# Patient Record
Sex: Female | Born: 1980 | Race: White | Hispanic: No | Marital: Married | Smoking: Never smoker
Health system: Southern US, Community
[De-identification: ages and names within clinical notes are randomized; demographics above are authoritative.]

## PROBLEM LIST (undated history)

## (undated) DIAGNOSIS — G510 Bell's palsy: Secondary | ICD-10-CM

## (undated) HISTORY — PX: LAPAROSCOPIC VAGINAL HYSTERECTOMY: SUR798

## (undated) HISTORY — PX: VERTICAL BANDED GASTROPLASTY: SHX1102

## (undated) HISTORY — PX: ABDOMINAL HYSTERECTOMY: SHX81

---

## 2005-10-02 ENCOUNTER — Ambulatory Visit: Payer: Self-pay | Admitting: Internal Medicine

## 2006-05-04 ENCOUNTER — Ambulatory Visit: Payer: Self-pay | Admitting: Family Medicine

## 2006-05-12 ENCOUNTER — Ambulatory Visit: Payer: Self-pay | Admitting: Family Medicine

## 2006-05-12 LAB — CONVERTED CEMR LAB
ALT: 15 units/L (ref 0–40)
AST: 14 units/L (ref 0–37)
Albumin: 3.7 g/dL (ref 3.5–5.2)
BUN: 14 mg/dL (ref 6–23)
Calcium: 9.1 mg/dL (ref 8.4–10.5)
Chloride: 105 meq/L (ref 96–112)
GFR calc non Af Amer: 94 mL/min
LDL Cholesterol: 96 mg/dL (ref 0–99)
VLDL: 11 mg/dL (ref 0–40)

## 2006-08-18 ENCOUNTER — Ambulatory Visit (HOSPITAL_BASED_OUTPATIENT_CLINIC_OR_DEPARTMENT_OTHER): Admission: RE | Admit: 2006-08-18 | Discharge: 2006-08-18 | Payer: Self-pay | Admitting: Orthopedic Surgery

## 2006-08-25 ENCOUNTER — Encounter: Payer: Self-pay | Admitting: Internal Medicine

## 2006-12-22 ENCOUNTER — Ambulatory Visit: Payer: Self-pay | Admitting: Family Medicine

## 2006-12-22 DIAGNOSIS — R1032 Left lower quadrant pain: Secondary | ICD-10-CM | POA: Insufficient documentation

## 2006-12-22 DIAGNOSIS — R1012 Left upper quadrant pain: Secondary | ICD-10-CM

## 2006-12-22 LAB — CONVERTED CEMR LAB
Blood in Urine, dipstick: NEGATIVE
Ketones, urine, test strip: NEGATIVE
Nitrite: NEGATIVE
RBC / HPF: 0
Urobilinogen, UA: NEGATIVE
WBC, UA: 0 cells/hpf

## 2006-12-23 ENCOUNTER — Encounter (INDEPENDENT_AMBULATORY_CARE_PROVIDER_SITE_OTHER): Payer: Self-pay | Admitting: Internal Medicine

## 2006-12-24 ENCOUNTER — Telehealth (INDEPENDENT_AMBULATORY_CARE_PROVIDER_SITE_OTHER): Payer: Self-pay | Admitting: Internal Medicine

## 2007-01-04 ENCOUNTER — Telehealth (INDEPENDENT_AMBULATORY_CARE_PROVIDER_SITE_OTHER): Payer: Self-pay | Admitting: Internal Medicine

## 2007-09-24 ENCOUNTER — Inpatient Hospital Stay (HOSPITAL_COMMUNITY): Admission: AD | Admit: 2007-09-24 | Discharge: 2007-09-24 | Payer: Self-pay | Admitting: Obstetrics

## 2007-09-29 ENCOUNTER — Inpatient Hospital Stay (HOSPITAL_COMMUNITY): Admission: AD | Admit: 2007-09-29 | Discharge: 2007-09-30 | Payer: Self-pay | Admitting: Obstetrics and Gynecology

## 2007-09-30 ENCOUNTER — Inpatient Hospital Stay (HOSPITAL_COMMUNITY): Admission: AD | Admit: 2007-09-30 | Discharge: 2007-10-02 | Payer: Self-pay | Admitting: Obstetrics and Gynecology

## 2007-12-28 ENCOUNTER — Ambulatory Visit: Payer: Self-pay | Admitting: Family Medicine

## 2008-01-03 ENCOUNTER — Telehealth (INDEPENDENT_AMBULATORY_CARE_PROVIDER_SITE_OTHER): Payer: Self-pay | Admitting: Internal Medicine

## 2008-01-10 ENCOUNTER — Telehealth: Payer: Self-pay | Admitting: Family Medicine

## 2008-03-20 ENCOUNTER — Ambulatory Visit: Payer: Self-pay | Admitting: Family Medicine

## 2008-04-10 ENCOUNTER — Telehealth (INDEPENDENT_AMBULATORY_CARE_PROVIDER_SITE_OTHER): Payer: Self-pay | Admitting: Internal Medicine

## 2010-08-29 NOTE — Assessment & Plan Note (Signed)
[facility]                           [facility] OFFICE NOTE   Erin Stephenson, Erin Stephenson                           MRN:          [id number]  DATE:05/04/2006                            DOB:          1980-09-18    NEW PATIENT HISTORY AND PHYSICAL:   CHIEF COMPLAINT:  A 30 year old white female here to establish with new  doctor.   HISTORY OF PRESENT ILLNESS:  Erin Stephenson has not seen a doctor other than  her gynecologist in many years.  She comes to the clinic today with the  following two acute issues:   1. Rash, acute:  She said she has been having a problem for about 2      weeks.  She noted red bumps initially begin across her arm, back      and inner thighs.  She changed dryer sheets, washed her clothes      again, began taking oatmeal baths and taking Benadryl, but the      areas remained to be very itchy.  She notes the redness and      irritation most after getting out of the shower.  She denies any      new medications, new perfumes or lotions, no new foods.  She has no      sick contacts or anyone in the family with a similar rash.  She      denies shortness of breath or difficulty swallowing.   1. Fluid in left ear:  She said she has had problems with congestion      for about 1 week.  She had some mild face pressure, sore throat and      postnasal drip.  She had some popping in both of her ears but      denies ear pain.  She has been taking Sudafed and Tylenol for 1      day.   REVIEW OF SYSTEMS:  No headache, no dizziness, no glasses, no hearing  problems.  No dyspnea, no chest pain, no palpitations.  No nausea,  vomiting, diarrhea or constipation.  She feels warmer than other people.  She has dry skin.  She has been sluggish off and on for 2 years.  She  does have a family history of hypothyroidism.   PAST MEDICAL HISTORY:  1. Endometriosis.  2. Chlamydia.  3. Ovarian cyst.  4. Abnormal Pap smear.  5. Dyspareunia.   HOSPITALIZATIONS,  SURGERIES, PROCEDURES:  1. 2002, cryotherapy.  2. Pap smear June 2007 within normal limits.   ALLERGIES:  None.   MEDICATIONS:  None.   SOCIAL HISTORY:  She does not smoke.  She drinks 1-2 alcoholic beverages  a week.  She is a Actuary at [employer].  She has been  married for 3 years.  She denies drug abuse.  She has no children.  She  does not get any regular exercise but was a Pharmacist, community in college.  She eats fruits, vegetables and a healthy diet.  She avoids fast food  and drinks lots of water.   FAMILY HISTORY:  Father alive at age 50, healthy.  Mother alive at age  17, healthy.  Paternal grandmother and paternal grandfather with  hypothyroidism.  She has two paternal uncles who had MI at an early age,  respectively age 71 and 19.  She has one sister, who is healthy.  She  has a maternal grandfather who has diabetes and no family history of  cancer.   PHYSICAL EXAMINATION:  VITAL SIGNS:  Height 4 feet 11-1/2 inches.  Weight 140.6.  Blood pressure 102/60, pulse 84, temperature 98.5.  GENERAL:  A healthy-appearing female in no apparent distress.  HEENT:  PERRLA.  Extraocular muscles intact.  Oropharynx with slight  erythema.  No tonsillar enlargement.  No tonsillar plaques.  Clear fluid  bilateral TMs, left greater than right.  No thyromegaly.  No  lymphadenopathy, supraclavicular or cervical.  LUNGS:  Clear to auscultation bilaterally.  No wheezes, rales or  rhonchi.  ABDOMEN:  Soft, nontender, normoactive bowel sounds, no  hepatosplenomegaly.  CARDIOVASCULAR:  Regular rate and rhythm, no murmurs, rubs or gallops.  Normal PMI.  2+ peripheral pulses.  No peripheral edema.  SKIN:  Occasional red, raised papules on bilateral arms, low back and  inner thighs.  Very infrequent, dry, flaky skin throughout.  MUSCULOSKELETAL:  Strength 5/5 in upper and lower extremities.  NEUROLOGIC:  Cranial nerves II-XII grossly intact.  Alert and oriented  x3.   ASSESSMENT AND  PLAN:  1. Rash, acute:  This is most likely secondary to dry skin and dry      winter air versus allergic reaction.  She will continue to use      Benadryl at night and may use Claritin during the day.  She will      continue with gentle soaps and will look to anything that she could      be allergic to.  There is no sign of burrows suggesting scabies.      She will use 0.1% triamcinolone cream on several of the areas as      needed for itchiness and redness over the next 2 weeks, 30 g given.      She will contact me if the rash does not improve with the steroid      cream and time.  2. Viral upper respiratory infection:  At this point in time she does      not have a bacterial infection.  She was encouraged to continue      Sudafed, Tylenol p.r.n.  The ear popping is most likely secondary      to eustachian tube dysfunction.  If congestion continues long-term,      she may need a nasal steroid.  3. Prevention.  Given her family history of early myocardial      infarction as well as her family history of hypothyroidism, I will      check a baseline cholesterol panel as well as a TSH.     Kerby Nora, MD  Electronically Signed    AB/MedQ  DD: 05/04/2006  DT: 05/04/2006  Job #: [id number]

## 2010-08-29 NOTE — Op Note (Signed)
NAMERYENN, HOWETH                  ACCOUNT NO.:  [id number]   MEDICAL RECORD NO.:  [id number]          PATIENT TYPE:  AMB   LOCATION:  [facility]SC                          FACILITY:  [facility]   PHYSICIAN:  Cindee Salt, M.D.       DATE OF BIRTH:  1980-04-15   DATE OF PROCEDURE:  08/18/2006  DATE OF DISCHARGE:                               OPERATIVE REPORT   PREOPERATIVE DIAGNOSIS:  Scapholunate ligament tear, right wrist.   POSTOPERATIVE DIAGNOSIS:  Scapholunate ligament tear, right wrist,  triangular fibrocartilage complex tear plus lunotriquetral tear, plus  subluxating extensor carpi ulnaris tendon, right wrist.   OPERATION:  Arthroscopy right wrist with for shrinkage debridement,  scapholunate lunotriquetral triangular fibrocartilage complex tear open  repair extensor carpi ulnaris sheath, right wrist.   SURGEON:  Cindee Salt, M.D.   ASSISTANTCarolyne Fiscal R.N.   ANESTHESIA:  Infraclavicular block.   ANESTHESIOLOGIST:  Zenon Mayo, MD.   HISTORY:  The patient is a 30 year old female who suffered an injury to  her right wrist.  This has not responded to conservative treatment.  She  has had continued pain and discomfort.  MRI reveals a scapholunate  ligament injury of questionable history and duration with an unknown  age.  She is admitted for arthroscopic inspection.  She also had  subluxation of her ECU sheath responding to injection.  She is admitted  for arthroscopy, possible debridement shrinkage, pinning of scapholunate  ligament with open repair ECU sheath.  She is aware of risks and  complications including infection, recurrence, injury to arteries,  nerves, tendons, numbness and tingling of the dorsal ulnar aspect of her  hand, dystrophy.  She has elected to proceed to have this done in the  preoperative area.  The patient is seen, questions encouraged and  answered.  The extremity marked by both the patient and surgeon.  Antibiotic given.   PROCEDURE:  The patient was  given infraclavicular block, brought to the  operating room where she was prepped and draped using DuraPrep, supine  position, right arm free after 3-minute dry time she was draped.  The  arthroscopy tower was placed 10 pounds traction applied.  The joint  inflated through 3/4 portal.  Care taken to break protect the EPL  tendon.  The transverse incision was made, deepened with hemostat, blunt  trocar was used enter the joint.  Joint was inspected.  The scapholunate  ligament was noted be torn.  The volar radial ligaments were intact.  There was some abrasions on the lunate.  The lunate fossa the ulnar side  showed no injury to the volar ligaments over the dorsal ligament injury  at the lunotriquetral and TFCC tear.  Irrigation catheter was placed in  6U, the scope was then positioned after a 04/05 portal was opened.  This  was localized with 22 gauge needle.  A probe was used to probe the TFCC  tear.  The lunotriquetral ligament was then inspected.  The majority was  found to be intact with a small tear on the dorsal aspect.  The  midcarpal joint  was then inspected after localization with a 22 gauge  needle.  A hemostat was used to enter the joint through a transverse  incision.  The joint was inspected.  A Geissler 2 type injury was  present to the scapholunate ligament.  There was no instability to the  lunotriquetral joint.  The articular surface of the STT midcarpal joint  showed no significant changes.  The scope was then reintroduced into the  03/04 portal and debridement triangle fibrocartilage complex was  performed with an Arthrowand and punch, a full radius shaver.  The  dorsal synovitis was then debrided with the Arthrowand, a shrinkage was  then performed of the dorsal aspect of the lunotriquetral joint.  The  scope was then introduced in the ulnar side and shrinkage performed to  the scapholunate.  The dorsal margin of the scapholunate ligament was  found to be intact.  This  significantly tightened the scapholunate  interval.  Debridement of the dorsal capsule with the Arthrowand was  performed removing any synovitis present.  There was no bleeding noted  in any of the ligaments, as such these were all felt to be old.  The  instruments were removed.  The limb was exsanguinated after removal from  the arthroscopy tower. The tourniquet placed high on the arm was  inflated to 250 mmHg.  A longitudinal incision was then made over the  ECU tendon distally, carried down through subcutaneous tissue.  Bleeders  electrocauterized.  The dorsal sensory branch of the ulnar nerve was  looked for but was not seen in the wound.  The ECU sheath was then  opened.  A flap was then created lifting it from the palmar aspect using  retinaculum.  This was left attached dorsally.  The ECU sheath was  opened.  The floor was explored.  No lesions were noted.  The flap was  then used to recreate the sheath, bringing it back over the tendon  holding it in a firm position in the tunnel through the ulna.  This was  sutured into position with interrupted 3-0 Vicryl sutures.  The wound  was irrigated.  The portals and incision was then closed with  interrupted 5-0 Vicryl Rapide sutures.  A sterile compressive dressing  long-arm splint applied.  The patient tolerated the procedure well and  was taken to the recovery observation in satisfactory condition.  She is  discharged home to return to [facility] [place] in one week on  Percocet.           ______________________________  Cindee Salt, M.D.     GK/MEDQ  D:  08/18/2006  T:  08/18/2006  Job:  [id number]

## 2011-01-08 LAB — CBC
HCT: 36.1
Hemoglobin: 10.4 — ABNORMAL LOW
Hemoglobin: 12.3
MCHC: 33.9
Platelets: 170
RBC: 3.53 — ABNORMAL LOW
WBC: 15.2 — ABNORMAL HIGH
WBC: 19.9 — ABNORMAL HIGH

## 2013-02-17 ENCOUNTER — Other Ambulatory Visit (HOSPITAL_COMMUNITY): Payer: Self-pay | Admitting: Obstetrics

## 2013-02-17 DIAGNOSIS — Z308 Encounter for other contraceptive management: Secondary | ICD-10-CM

## 2013-03-10 ENCOUNTER — Ambulatory Visit (HOSPITAL_COMMUNITY)
Admission: RE | Admit: 2013-03-10 | Discharge: 2013-03-10 | Disposition: A | Discharge status: Home / Self Care | Payer: No Typology Code available for payment source | Source: Ambulatory Visit | Attending: Obstetrics | Admitting: Obstetrics

## 2013-03-10 DIAGNOSIS — Z308 Encounter for other contraceptive management: Secondary | ICD-10-CM

## 2013-03-10 DIAGNOSIS — N921 Excessive and frequent menstruation with irregular cycle: Secondary | ICD-10-CM | POA: Insufficient documentation

## 2013-03-10 DIAGNOSIS — Z3049 Encounter for surveillance of other contraceptives: Secondary | ICD-10-CM | POA: Insufficient documentation

## 2013-03-10 MED ORDER — IOHEXOL 300 MG/ML  SOLN
20.0000 mL | Freq: Once | INTRAMUSCULAR | Status: AC | PRN
  Administered 2013-03-10: 20 mL

## 2014-03-02 DIAGNOSIS — G8929 Other chronic pain: Secondary | ICD-10-CM | POA: Insufficient documentation

## 2014-09-21 DIAGNOSIS — F5101 Primary insomnia: Secondary | ICD-10-CM | POA: Insufficient documentation

## 2014-10-16 ENCOUNTER — Encounter: Payer: Self-pay | Admitting: Emergency Medicine

## 2014-10-16 ENCOUNTER — Ambulatory Visit
Admission: EM | Admit: 2014-10-16 | Discharge: 2014-10-16 | Disposition: A | Discharge status: Home / Self Care | Payer: BLUE CROSS/BLUE SHIELD | Attending: Family Medicine | Admitting: Family Medicine

## 2014-10-16 DIAGNOSIS — R200 Anesthesia of skin: Secondary | ICD-10-CM | POA: Diagnosis not present

## 2014-10-16 DIAGNOSIS — R208 Other disturbances of skin sensation: Secondary | ICD-10-CM

## 2014-10-16 DIAGNOSIS — H5712 Ocular pain, left eye: Secondary | ICD-10-CM | POA: Diagnosis present

## 2014-10-16 HISTORY — DX: Bell's palsy: G51.0

## 2014-10-16 LAB — CBC WITH DIFFERENTIAL/PLATELET
Basophils Absolute: 0.1 10*3/uL (ref 0–0.1)
Basophils Relative: 1 %
Eosinophils Absolute: 0.1 10*3/uL (ref 0–0.7)
Eosinophils Relative: 2 %
HCT: 41.8 % (ref 35.0–47.0)
Hemoglobin: 14 g/dL (ref 12.0–16.0)
Lymphocytes Relative: 29 %
Lymphs Abs: 2.2 10*3/uL (ref 1.0–3.6)
MCH: 28 pg (ref 26.0–34.0)
MCHC: 33.6 g/dL (ref 32.0–36.0)
MCV: 83.2 fL (ref 80.0–100.0)
Monocytes Absolute: 0.4 10*3/uL (ref 0.2–0.9)
Monocytes Relative: 6 %
Neutro Abs: 4.9 10*3/uL (ref 1.4–6.5)
Neutrophils Relative %: 62 %
Platelets: 194 10*3/uL (ref 150–440)
RBC: 5.02 MIL/uL (ref 3.80–5.20)
RDW: 13.9 % (ref 11.5–14.5)
WBC: 7.8 10*3/uL (ref 3.6–11.0)

## 2014-10-16 LAB — COMPREHENSIVE METABOLIC PANEL
ALT: 20 U/L (ref 14–54)
AST: 16 U/L (ref 15–41)
Albumin: 4.6 g/dL (ref 3.5–5.0)
Alkaline Phosphatase: 78 U/L (ref 38–126)
Anion gap: 9 (ref 5–15)
BUN: 14 mg/dL (ref 6–20)
CO2: 27 mmol/L (ref 22–32)
Calcium: 9.3 mg/dL (ref 8.9–10.3)
Chloride: 101 mmol/L (ref 101–111)
Creatinine, Ser: 0.83 mg/dL (ref 0.44–1.00)
GFR calc Af Amer: >60 mL/min (ref >60)
GFR calc non Af Amer: >60 mL/min (ref >60)
Glucose, Bld: 86 mg/dL (ref 65–99)
Potassium: 3.8 mmol/L (ref 3.5–5.1)
Sodium: 137 mmol/L (ref 135–145)
Total Bilirubin: 0.4 mg/dL (ref 0.3–1.2)
Total Protein: 8.1 g/dL (ref 6.5–8.1)

## 2014-10-16 LAB — C-REACTIVE PROTEIN: CRP: 0.7 mg/dL (ref <1.0)

## 2014-10-16 LAB — SEDIMENTATION RATE: Sed Rate: 14 mm/hr (ref 0–20)

## 2014-10-16 MED ORDER — PREDNISONE 50 MG PO TABS: ORAL_TABLET | ORAL | Status: DC

## 2014-10-16 NOTE — ED Provider Notes (Signed)
CSN: [id number]     Arrival date & time 10/16/14  0931 History   None    Chief Complaint  Patient presents with  . Eye Pain    left  . Facial Pain   (Consider location/radiation/quality/duration/timing/severity/associated sxs/prior Treatment) HPI  This a 34 year old female who presents with right eye pain when she gazes medially or inferiorly that started on Thursday 5 days ago. Today she was seen by her optometrist who dilated her right eye and told her that she probably has a Bell's palsy and recommended she be seen today. Her primary care did not have room on their schedule and recommended she come to the urgent care. He states that she has tingling whenever she strokes or for head I will extend into her right cheek right neck and upper chest. She states that her hair hurts in her scalp on that side. She has no noticeable palsy of her facial muscles. She has no other constitutional symptoms. She has no health issues other than a low vitamin D for which she is on replacement.  Past Medical History  Diagnosis Date  . Bell's palsy    Past Surgical History  Procedure Laterality Date  . Abdominal hysterectomy     History reviewed. No pertinent family history. History  Substance Use Topics  . Smoking status: Never Smoker   . Smokeless tobacco: Never Used  . Alcohol Use: Yes   OB History    No data available     Review of Systems  HENT: Positive for ear pain.   Eyes: Positive for photophobia, pain and redness.  Neurological: Positive for numbness.  All other systems reviewed and are negative.   Allergies  Review of patient's allergies indicates no known allergies.  Home Medications   Prior to Admission medications   Medication Sig Start Date End Date Taking? Authorizing Provider  Vitamin D, Ergocalciferol, (DRISDOL) 50000 UNITS CAPS capsule Take 50,000 Units by mouth every 7 (seven) days.   Yes Historical Provider, MD  predniSONE (DELTASONE) 50 MG tablet Take 50 mg daily for  5 days 10/16/14   Lutricia Feil, PA-C   BP 108/74 mmHg  Pulse 67  Temp(Src) 98 F (36.7 C) (Oral)  Resp 16  Ht  (1.499 m)  Wt 177 lb (80.287 kg)  BMI 35.73 kg/m2  SpO2 100% Physical Exam  Constitutional: She is oriented to person, place, and time. She appears well-developed and well-nourished.  HENT:  Head: Normocephalic and atraumatic.  Right Ear: External ear normal.  Left Ear: External ear normal.  Stroking lately the skin of the forehead in her scalp over the parietal area temple area causes her to have a tingling sensation over the right side of her face extending into her neck upper chest and into her right arm. Face that the tingling is momentary not obnoxious and as soon as the stroking of the skin ceases so does the tingling. Pressure on that area also causes the same symptoms.  Eyes: EOM are normal. Pupils are equal, round, and reactive to light. Right eye exhibits no discharge. Left eye exhibits no discharge.  Examination the eyes shows the right eye to be dilated which was performed in the optometrist's office before coming here. EOMs are intact and full with complaints of pain with medial gaze and inferior gaze of the right eye. No lid lag is noticed no ptosis is present. She is able to override shot against resistance.  Neck: Neck supple.  Musculoskeletal: Normal range of motion. She  exhibits no edema or tenderness.  Neurological: She is alert and oriented to person, place, and time. She has normal reflexes. No cranial nerve deficit. She exhibits normal muscle tone. Coordination normal.  Cranial nerves are intact motor wise with no palsies observed. She is easily able to close her eyes and resist opening blows out her cheeks and tongue is midline he smiles normally with no lag. Is able to frown without noticeable palsy. Neck range of motion is full and comfortable  Skin: Skin is warm and dry. No rash noted. No erythema. No pallor.  No rashes are seen on the face no  vesicles are seen. Her eye was examined by an optometrist prior to her presentation at our clinic.  Psychiatric: She has a normal mood and affect. Judgment and thought content normal.    ED Course  Procedures (including critical care time) Labs Review Labs Reviewed  CBC WITH DIFFERENTIAL/PLATELET  COMPREHENSIVE METABOLIC PANEL  SEDIMENTATION RATE  RHEUMATOID FACTOR  C-REACTIVE PROTEIN  EPSTEIN-BARR VIRUS NUCLEAR ANTIGEN ANTIBODY, IGG    Imaging Review No results found.   MDM   1. Right facial numbness    Discharge Medication List as of 10/16/2014 12:54 PM    START taking these medications   Details  predniSONE (DELTASONE) 50 MG tablet Take 50 mg daily for 5 days, Print      Plan: 1. Diagnosis reviewed with patient 2. rx as per orders; risks, benefits, potential side effects reviewed with patient 3. Recommend supportive treatment with rest fluids.  4. F/u with PCP this week   We discussed at length with the patient her findings and lack of findings. She has no facial palsy but does have sensory changes facial nerve distribution. Her cranial nerves are intact. Her eyes dilated but this was iatrogenic from the optometrist earlier today. Uncertain exactly of what is going on possibly viral infection in the sensory portion of the nerve (her son had a very serious viral infection 2 weeks ago) that she may have contracted. We will treat her with prednisone 50 mg daily for the next 5 days. I've asked her to contact her primary care physician this afternoon to see if she can be seen this week for follow-up possible referral to a neurologist. If she has any worsening of her condition or any new symptoms she should be seen immediately in the emergency room. She was also seen by Dr. Izell Carolinaonti.  Lutricia FeilWilliam P Roemer, PA-C 10/16/14 1316

## 2014-10-16 NOTE — ED Notes (Signed)
Patient c/o facial pain and right eye irritation and painful hair scalp since Thursday.

## 2014-10-16 NOTE — Discharge Instructions (Signed)

## 2014-10-17 LAB — RHEUMATOID FACTOR: Rhuematoid fact SerPl-aCnc: 7.3 IU/mL (ref 0.0–13.9)

## 2015-05-24 ENCOUNTER — Other Ambulatory Visit: Payer: Self-pay | Admitting: Bariatrics

## 2015-05-28 ENCOUNTER — Ambulatory Visit: Payer: BLUE CROSS/BLUE SHIELD

## 2015-05-28 DIAGNOSIS — K449 Diaphragmatic hernia without obstruction or gangrene: Secondary | ICD-10-CM | POA: Diagnosis not present

## 2015-05-28 DIAGNOSIS — K219 Gastro-esophageal reflux disease without esophagitis: Secondary | ICD-10-CM | POA: Diagnosis not present

## 2015-05-28 DIAGNOSIS — Z01818 Encounter for other preprocedural examination: Secondary | ICD-10-CM | POA: Diagnosis not present

## 2015-05-29 ENCOUNTER — Ambulatory Visit
Admission: RE | Admit: 2015-05-29 | Discharge: 2015-05-29 | Disposition: A | Discharge status: Home / Self Care | Payer: BLUE CROSS/BLUE SHIELD | Source: Ambulatory Visit | Attending: Bariatrics | Admitting: Bariatrics

## 2015-05-29 ENCOUNTER — Other Ambulatory Visit: Payer: Self-pay | Admitting: Bariatrics

## 2015-05-29 DIAGNOSIS — Z01818 Encounter for other preprocedural examination: Secondary | ICD-10-CM | POA: Insufficient documentation

## 2015-05-29 DIAGNOSIS — K219 Gastro-esophageal reflux disease without esophagitis: Secondary | ICD-10-CM | POA: Insufficient documentation

## 2015-05-29 DIAGNOSIS — K449 Diaphragmatic hernia without obstruction or gangrene: Secondary | ICD-10-CM | POA: Insufficient documentation

## 2015-08-05 DIAGNOSIS — Z9884 Bariatric surgery status: Secondary | ICD-10-CM | POA: Insufficient documentation

## 2015-09-30 DIAGNOSIS — G8929 Other chronic pain: Secondary | ICD-10-CM | POA: Insufficient documentation

## 2015-09-30 DIAGNOSIS — M25562 Pain in left knee: Secondary | ICD-10-CM | POA: Insufficient documentation

## 2017-04-16 IMAGING — RF DG UGI W/O KUB
11 series · 11 of 11 positions shown · non-contrast
Comparison: None in PACs

CLINICAL DATA: Pre bariatric screening, history of gastroesophageal
reflux

EXAM:
UPPER GI SERIES WITHOUT KUB
TECHNIQUE: Routine upper GI series was performed with thin/high density/water
soluble barium. Effervescent crystals and a barium tablet were also
administered.
FLUOROSCOPY TIME:  Fluoroscopy Time (in minutes and seconds): 0
minutes, 54 seconds
Number of Acquired Images:  11

[Series 1: fluoro_barium 2fps_bw · 0.17mm/px · 1 of 1 slices shown (1 of 11)]
[im 1/1]
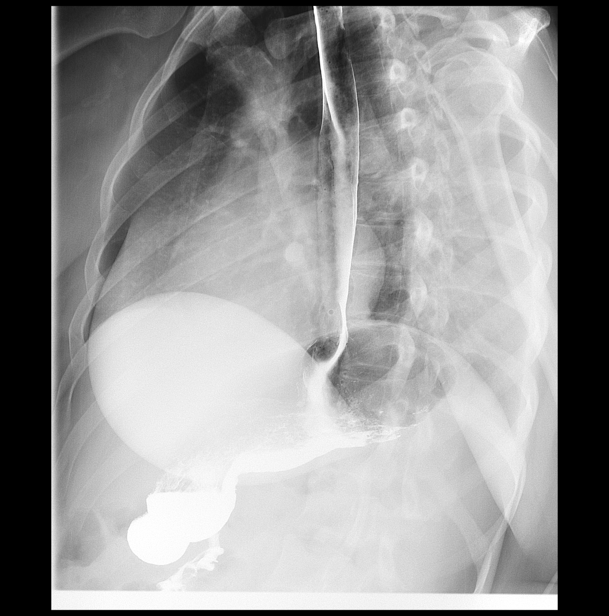

[Series 2: fluoro_barium 2fps_bw · 0.17mm/px · 1 of 1 slices shown (2 of 11)]
[im 1/1]
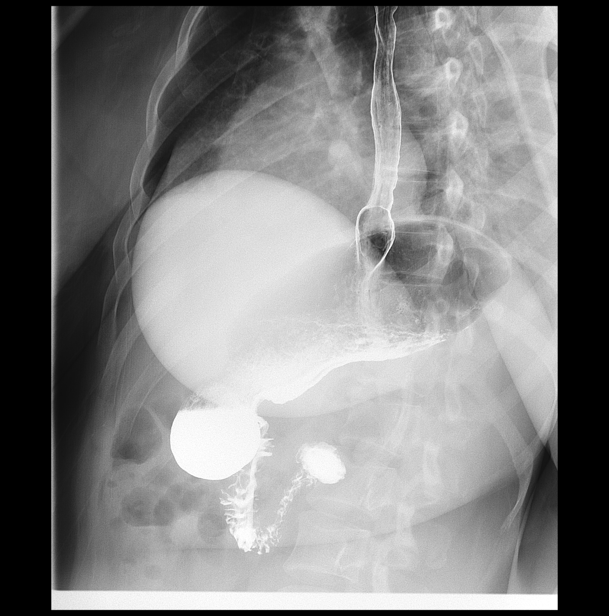

[Series 3: fluoro_barium 2fps_bw · 0.18mm/px · 1 of 1 slices shown (3 of 11)]
[im 1/1]
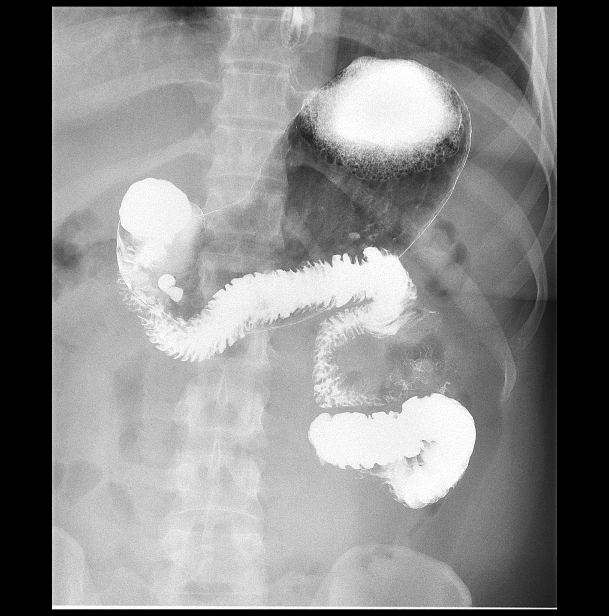

[Series 4: fluoro_barium 2fps_bw · 0.18mm/px · 1 of 1 slices shown (4 of 11)]
[im 1/1]
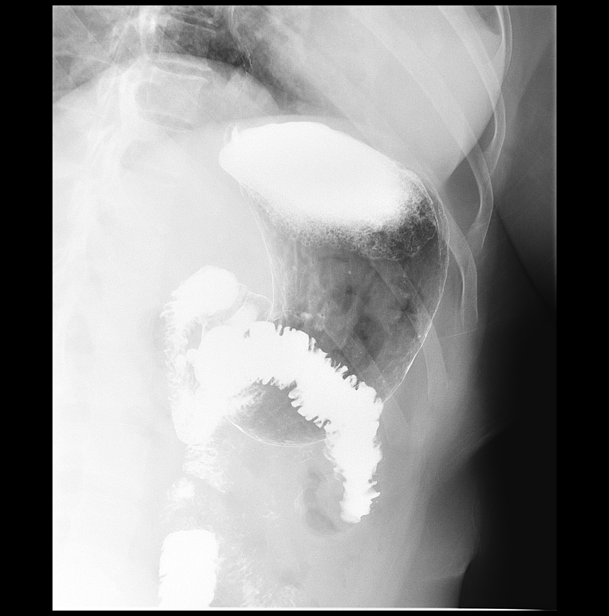

[Series 5: fluoro_barium 2fps_bw · 0.18mm/px · 1 of 1 slices shown (5 of 11)]
[im 1/1]
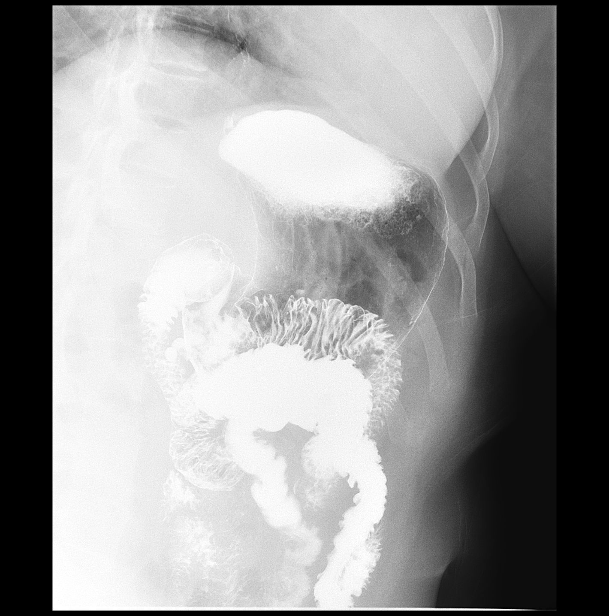

[Series 6: fluoro_barium 2fps_bw · 0.18mm/px · 1 of 1 slices shown (6 of 11)]
[im 1/1]
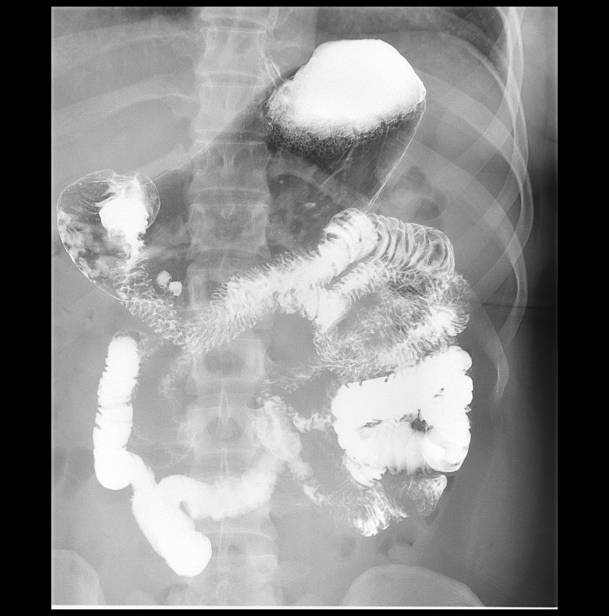

[Series 7: fluoro_barium 2fps_bw · 0.18mm/px · 1 of 1 slices shown (7 of 11)]
[im 1/1]
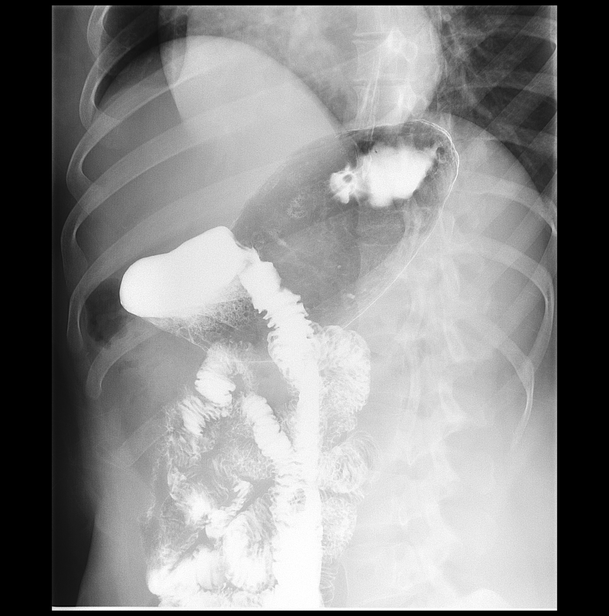

[Series 8: fluoro_barium 2fps_bw · 0.19mm/px · 1 of 1 slices shown (8 of 11)]
[im 1/1]
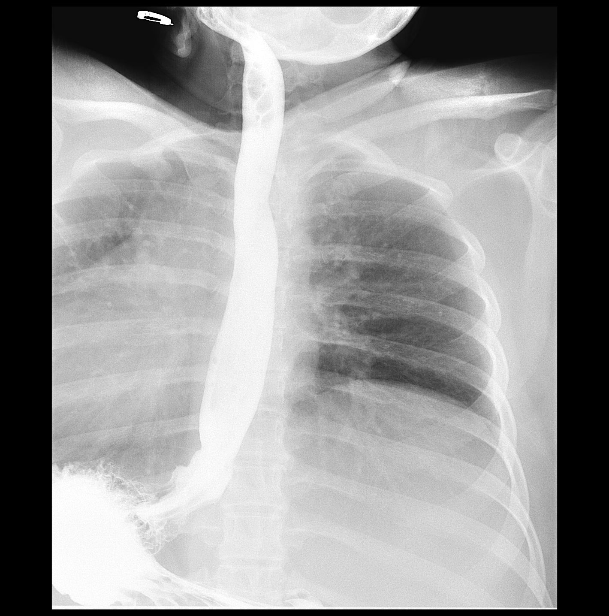

[Series 9: fluoro_barium 2fps_bw · 0.19mm/px · 1 of 1 slices shown (9 of 11)]
[im 1/1]
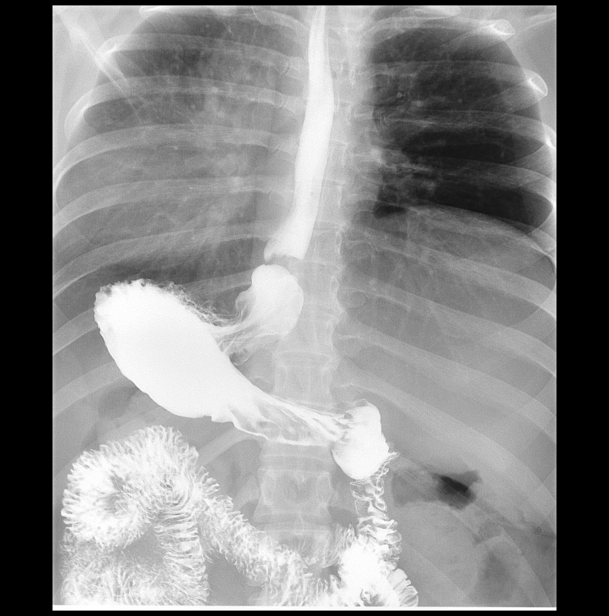

[Series 10: fluoro_barium 2fps_bw · 0.19mm/px · 1 of 1 slices shown (10 of 11)]
[im 1/1]
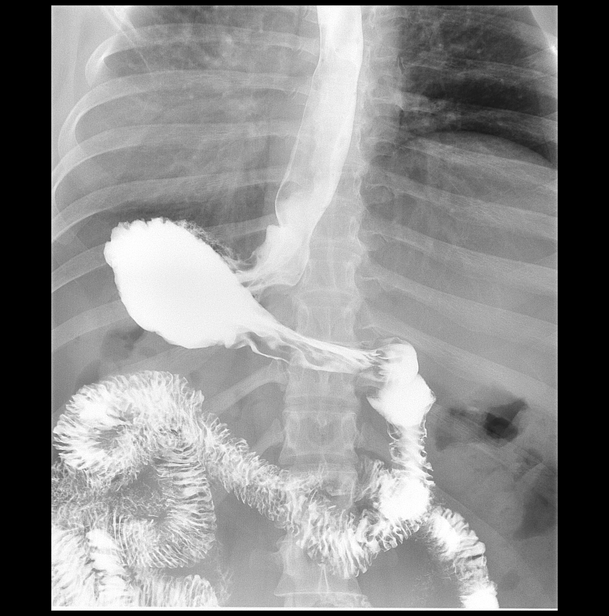

[Series 11: fluoro_barium 2fps_bw · 0.19mm/px · 1 of 1 slices shown (11 of 11)]
[im 1/1]
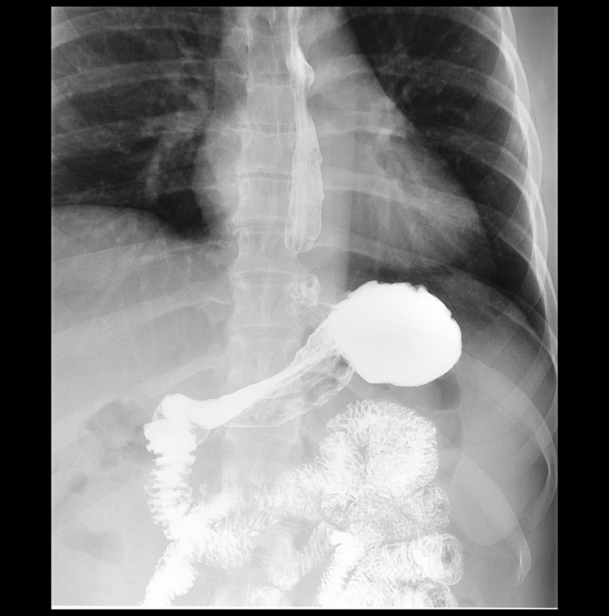

[11 of 11 positions shown; findings below may reference images not displayed]

FINDINGS: The patient ingested the thick and thin barium and the effervescent
crystals without difficulty. The cervical and thoracic esophagus
were normal in a survey fashion. There was a small reducible hiatal
hernia. A small amount of gastroesophageal reflux was observed.
There was no evidence of stricture nor esophagitis. The barium
tablet passed without difficulty.

The stomach distended well. Coating of the mucosa was limited.
Gastric emptying was prompt. The duodenal bulb and C-sweep were
normal in position. Tiny duodenal diverticula were noted arising
from the second portion of the duodenum.
IMPRESSION: 1. Small reducible hiatal hernia with small amount of
gastroesophageal reflux. No evidence of stricture nor esophagitis.
2. Normal appearance of the stomach, duodenal bulb, and C-sweep.
There are 2 tiny duodenal diverticulum arising from the second
portion of the duodenum.

## 2017-09-07 ENCOUNTER — Other Ambulatory Visit: Payer: Self-pay | Admitting: Family Medicine

## 2017-09-07 DIAGNOSIS — N644 Mastodynia: Secondary | ICD-10-CM

## 2017-09-08 ENCOUNTER — Other Ambulatory Visit: Payer: Self-pay | Admitting: Family Medicine

## 2017-09-09 ENCOUNTER — Other Ambulatory Visit: Payer: Self-pay | Admitting: Family Medicine

## 2017-09-09 DIAGNOSIS — N644 Mastodynia: Secondary | ICD-10-CM

## 2017-09-15 ENCOUNTER — Ambulatory Visit
Admission: RE | Admit: 2017-09-15 | Discharge: 2017-09-15 | Disposition: A | Discharge status: Home / Self Care | Payer: BLUE CROSS/BLUE SHIELD | Source: Ambulatory Visit | Attending: Family Medicine | Admitting: Family Medicine

## 2017-09-15 DIAGNOSIS — N644 Mastodynia: Secondary | ICD-10-CM

## 2019-02-07 DIAGNOSIS — F410 Panic disorder [episodic paroxysmal anxiety] without agoraphobia: Secondary | ICD-10-CM | POA: Diagnosis not present

## 2019-02-20 ENCOUNTER — Encounter: Payer: Self-pay | Admitting: Emergency Medicine

## 2019-02-20 ENCOUNTER — Ambulatory Visit
Admission: EM | Admit: 2019-02-20 | Discharge: 2019-02-20 | Disposition: A | Discharge status: Home / Self Care | Payer: BC Managed Care – PPO | Attending: Family Medicine | Admitting: Family Medicine

## 2019-02-20 ENCOUNTER — Other Ambulatory Visit: Payer: Self-pay

## 2019-02-20 DIAGNOSIS — W260XXA Contact with knife, initial encounter: Secondary | ICD-10-CM

## 2019-02-20 DIAGNOSIS — S61211A Laceration without foreign body of left index finger without damage to nail, initial encounter: Secondary | ICD-10-CM

## 2019-02-20 MED ORDER — MUPIROCIN 2 % EX OINT: 1.0000 "application " | TOPICAL_OINTMENT | Freq: Three times a day (TID) | CUTANEOUS | 0 refills | Status: DC

## 2019-02-20 NOTE — Discharge Instructions (Signed)
Keep dry for 24 hours.  Apply mupirocin ointment 3 times daily till sutures are removed in 10 days.  If any signs or symptoms of infection occur return to our clinic immediately

## 2019-02-20 NOTE — ED Triage Notes (Signed)
Pt has laceration on her left index, occurred about 45 minutes ago. She was cutting up avocado and cut her finger with a knife. Last tetanus vaccine was 07/2018 per pt. Care every where has 09/02/2017

## 2019-02-20 NOTE — ED Provider Notes (Signed)
[facility]    CSN: [id number] Arrival date & time: 02/20/19  1935      History   Chief Complaint Chief Complaint  Patient presents with  . Laceration    left index finger    HPI Erin Stephenson is a 38 y.o. female.   HPI  38 year old female resents with a laceration to her left nondominant index finger.  This happened approxiforty 5 minutes prior to arrival.  She was cutting and avocado and slipped off of the stem lacerating her radial aspect of her index finger lying the DIP joint.  Current tetanus was in April 2020.       Past Medical History:  Diagnosis Date  . Bell's palsy     Patient Active Problem List   Diagnosis Date Noted  . LUQ PAIN 12/22/2006  . ABDOMINAL PAIN, LEFT LOWER QUADRANT 12/22/2006    Past Surgical History:  Procedure Laterality Date  . ABDOMINAL HYSTERECTOMY      OB History   No obstetric history on file.      Home Medications    Prior to Admission medications   Medication Sig Start Date End Date Taking? Authorizing Provider  sertraline (ZOLOFT) 50 MG tablet Take 50 mg by mouth daily. 02/07/19  Yes [provider]  mupirocin ointment (BACTROBAN) 2 % Apply 1 application topically 3 (three) times daily. 02/20/19   Lutricia Feil, PA-C    Family History Family History  Problem Relation Age of Onset  . Healthy Mother   . Healthy Father     Social History Social History   Tobacco Use  . Smoking status: Never Smoker  . Smokeless tobacco: Never Used  Substance Use Topics  . Alcohol use: Yes  . Drug use: No     Allergies   Patient has no known allergies.   Review of Systems Review of Systems  Constitutional: Positive for activity change. Negative for appetite change, chills, diaphoresis, fatigue and fever.  Skin: Positive for wound.  All other systems reviewed and are negative.    Physical Exam Triage Vital Signs ED Triage Vitals  Enc Vitals Group     BP 02/20/19 1950 107/71     Pulse Rate  02/20/19 1950 74     Resp 02/20/19 1950 18     Temp 02/20/19 1950 99.1 F (37.3 C)     Temp Source 02/20/19 1950 Oral     SpO2 02/20/19 1950 100 %     Weight 02/20/19 1946 110 lb (49.9 kg)     Height 02/20/19 1946 4' 11.5" (1.511 m)     Head Circumference --      Peak Flow --      Pain Score 02/20/19 1945 2     Pain Loc --      Pain Edu? --      Excl. in GC? --    No data found.  Updated Vital Signs BP 107/71 (BP Location: Left Arm)   Pulse 74   Temp 99.1 F (37.3 C) (Oral)   Resp 18   Ht 4' 11.5" (1.511 m)   Wt 110 lb (49.9 kg)   SpO2 100%   BMI 21.85 kg/m   Visual Acuity Right Eye Distance:   Left Eye Distance:   Bilateral Distance:    Right Eye Near:   Left Eye Near:    Bilateral Near:     Physical Exam Vitals signs and nursing note reviewed.  Constitutional:      General: She is not  in acute distress.    Appearance: Normal appearance. She is normal weight. She is not ill-appearing, toxic-appearing or diaphoretic.  HENT:     Head: Normocephalic and atraumatic.  Eyes:     Conjunctiva/sclera: Conjunctivae normal.  Neck:     Musculoskeletal: Normal range of motion and neck supple.  Musculoskeletal: Normal range of motion.        General: Tenderness and signs of injury present.  Skin:    General: Skin is warm and dry.     Comments: Examination of the left nondominant index finger shows a curvilinear laceration measuring 37mm and 1 mm deep.  Continues to bleed.  Normal sensation distally.  Neurological:     General: No focal deficit present.     Mental Status: She is alert and oriented to person, place, and time.  Psychiatric:        Mood and Affect: Mood normal.        Behavior: Behavior normal.        Thought Content: Thought content normal.        Judgment: Judgment normal.      UC Treatments / Results  Labs (all labs ordered are listed, but only abnormal results are displayed) Labs Reviewed - No data to display  EKG   Radiology No results  found.  Procedures Laceration Repair  Date/Time: 02/20/2019 8:25 PM Performed by: Lorin Picket, PA-C Authorized by: Coral Spikes, DO   Consent:    Consent obtained:  Verbal   Consent given by:  Patient   Risks discussed:  Infection Anesthesia (see MAR for exact dosages):    Anesthesia method:  Local infiltration   Local anesthetic:  Lidocaine 1% w/o epi Laceration details:    Location:  Finger   Finger location:  L index finger   Length (cm):  0.5   Depth (mm):  1 Repair type:    Repair type:  Simple Pre-procedure details:    Preparation:  Patient was prepped and draped in usual sterile fashion Treatment:    Area cleansed with:  Betadine   Amount of cleaning:  Standard   Irrigation solution:  Tap water   Irrigation volume:  Soak   Visualized foreign bodies/material removed: no   Skin repair:    Repair method:  Sutures   Suture size:  5-0   Suture material:  Prolene   Suture technique:  Simple interrupted   Number of sutures:  3 Approximation:    Approximation:  Close Post-procedure details:    Dressing:  Antibiotic ointment, non-adherent dressing and sterile dressing   Patient tolerance of procedure:  Tolerated well, no immediate complications Comments:     Keep dry for 24 hours.  Apply Bactroban ointment 3 times daily until sutures are removed in 10 days.  Return to our clinic for any signs or symptoms of infection which we discussed.   (including critical care time)  Medications Ordered in UC Medications - No data to display  Initial Impression / Assessment and Plan / UC Course  I have reviewed the triage vital signs and the nursing notes.  Pertinent labs & imaging results that were available during my care of the patient were reviewed by me and considered in my medical decision making (see chart for details).    Keep dry for 24 hours.  Apply Bactroban ointment 3 times daily until sutures are removed in 10 days.  Return to our clinic for any signs or  symptoms of infection which we discussed.  Final Clinical Impressions(s) / UC Diagnoses   Final diagnoses:  Laceration of left index finger without foreign body without damage to nail, initial encounter     Discharge Instructions     Keep dry for 24 hours.  Apply mupirocin ointment 3 times daily till sutures are removed in 10 days.  If any signs or symptoms of infection occur return to our clinic immediately   ED Prescriptions    Medication Sig Dispense Auth. Provider   mupirocin ointment (BACTROBAN) 2 % Apply 1 application topically 3 (three) times daily. 22 g Lutricia Feiloemer, Shemuel Harkleroad P, PA-C     PDMP not reviewed this encounter.   Lutricia FeilRoemer, Lenard Kampf P, PA-C 02/20/19 2031

## 2019-11-10 DIAGNOSIS — Z0000 Encounter for general adult medical examination without abnormal findings: Secondary | ICD-10-CM | POA: Diagnosis not present

## 2019-11-10 DIAGNOSIS — Z9884 Bariatric surgery status: Secondary | ICD-10-CM | POA: Diagnosis not present

## 2019-11-16 DIAGNOSIS — Z713 Dietary counseling and surveillance: Secondary | ICD-10-CM | POA: Diagnosis not present

## 2019-11-30 DIAGNOSIS — Z713 Dietary counseling and surveillance: Secondary | ICD-10-CM | POA: Diagnosis not present

## 2020-03-25 DIAGNOSIS — M62830 Muscle spasm of back: Secondary | ICD-10-CM | POA: Diagnosis not present

## 2020-03-25 DIAGNOSIS — M955 Acquired deformity of pelvis: Secondary | ICD-10-CM | POA: Diagnosis not present

## 2020-03-25 DIAGNOSIS — M9905 Segmental and somatic dysfunction of pelvic region: Secondary | ICD-10-CM | POA: Diagnosis not present

## 2020-03-25 DIAGNOSIS — M9903 Segmental and somatic dysfunction of lumbar region: Secondary | ICD-10-CM | POA: Diagnosis not present

## 2020-03-27 DIAGNOSIS — R197 Diarrhea, unspecified: Secondary | ICD-10-CM | POA: Diagnosis not present

## 2020-03-27 DIAGNOSIS — R194 Change in bowel habit: Secondary | ICD-10-CM | POA: Diagnosis not present

## 2020-10-16 DIAGNOSIS — M9903 Segmental and somatic dysfunction of lumbar region: Secondary | ICD-10-CM | POA: Diagnosis not present

## 2020-10-16 DIAGNOSIS — M62830 Muscle spasm of back: Secondary | ICD-10-CM | POA: Diagnosis not present

## 2020-10-16 DIAGNOSIS — M9905 Segmental and somatic dysfunction of pelvic region: Secondary | ICD-10-CM | POA: Diagnosis not present

## 2020-10-16 DIAGNOSIS — M955 Acquired deformity of pelvis: Secondary | ICD-10-CM | POA: Diagnosis not present

## 2020-11-21 DIAGNOSIS — F411 Generalized anxiety disorder: Secondary | ICD-10-CM | POA: Diagnosis not present

## 2020-12-18 ENCOUNTER — Other Ambulatory Visit: Payer: Self-pay

## 2020-12-18 ENCOUNTER — Encounter: Payer: Self-pay | Admitting: Family Medicine

## 2020-12-18 ENCOUNTER — Ambulatory Visit (INDEPENDENT_AMBULATORY_CARE_PROVIDER_SITE_OTHER): Payer: BC Managed Care – PPO | Admitting: Family Medicine

## 2020-12-18 VITALS — BP 107/67 | HR 73 | Ht 59.5 in | Wt 128.0 lb

## 2020-12-18 DIAGNOSIS — Z01419 Encounter for gynecological examination (general) (routine) without abnormal findings: Secondary | ICD-10-CM

## 2020-12-18 DIAGNOSIS — Z903 Acquired absence of stomach [part of]: Secondary | ICD-10-CM | POA: Diagnosis not present

## 2020-12-18 DIAGNOSIS — Z90710 Acquired absence of both cervix and uterus: Secondary | ICD-10-CM

## 2020-12-18 NOTE — Progress Notes (Signed)
   GYNECOLOGY ANNUAL PREVENTATIVE CARE ENCOUNTER NOTE  Subjective:   Erin Stephenson is a 40 y.o. G10P1001 female here for a routine annual gynecologic exam.  Current complaints: gaining weight unexpectedly. Had gastric sleeve and reports she is not gaining but diet has not changed. She is sp hysterectomy.   Denies abnormal vaginal bleeding, discharge, pelvic pain, problems with intercourse or other gynecologic concerns.    Gynecologic History No LMP recorded. Patient has had a hysterectomy. Contraception: status post hysterectomy Last Pap: prior to hysterectomy- WNL per patient Last mammogram: 2019- WNL   The following portions of the patient's history were reviewed and updated as appropriate: allergies, current medications, past family history, past medical history, past social history, past surgical history and problem list.  Review of Systems Pertinent items are noted in HPI.   Objective:  BP 107/67   Pulse 73   Ht 4' 11.5" (1.511 m)   Wt 128 lb (58.1 kg)   BMI 25.42 kg/m  CONSTITUTIONAL: Well-developed, well-nourished female in no acute distress.  HENT:  Normocephalic, atraumatic, External right and left ear normal. Oropharynx is clear and moist EYES:  No scleral icterus.  NECK: Normal range of motion, supple, no masses.  Normal thyroid.  SKIN: Skin is warm and dry. No rash noted. Not diaphoretic. No erythema. No pallor. NEUROLOGIC: Alert and oriented to person, place, and time. Normal reflexes, muscle tone coordination. No cranial nerve deficit noted. PSYCHIATRIC: Normal mood and affect. Normal behavior. Normal judgment and thought content. CARDIOVASCULAR: Normal heart rate noted, regular rhythm. 2+ distal pulses. RESPIRATORY: Effort and breath sounds normal, no problems with respiration noted. BREASTS: Symmetric in size. No masses, skin changes, nipple drainage, or lymphadenopathy. ABDOMEN: Soft,  no distention noted.  No tenderness, rebound or guarding.  PELVIC: Normal appearing  external genitalia; normal appearing vaginal mucosa and cervix.  No abnormal discharge noted.  Surgically absent uterus. No adnexal tenderness. MUSCULOSKELETAL: Normal range of motion.    Assessment and Plan:  1) Annual gynecologic examination:  Routine preventative health maintenance measures emphasized.  1. Well woman exam with routine gynecological exam - Accepts yearly labs - Reviewed no need for paps - Recommend mammogram at 41 - CBC - TSH - Hemoglobin A1c - Comprehensive metabolic panel - Lipid panel - Follicle stimulating hormone - Amb Ref to Medical Weight Management  2. S/P hysterectomy - Follicle stimulating hormone  3. S/P gastric sleeve procedure - Discussed referral to Weight Management to help with her weight gain given her surgical treatment - Amb Ref to Medical Weight Management  Please refer to After Visit Summary for other counseling recommendations.   Return in about 1 year (around 12/18/2021) for Yearly wellness exam.  Federico Flake, MD, MPH, ABFM Attending Physician Center for [facility]

## 2020-12-19 LAB — COMPREHENSIVE METABOLIC PANEL
ALT: 10 IU/L (ref 0–32)
AST: 14 IU/L (ref 0–40)
Albumin/Globulin Ratio: 2 (ref 1.2–2.2)
Albumin: 4.7 g/dL (ref 3.8–4.8)
Alkaline Phosphatase: 54 IU/L (ref 44–121)
BUN/Creatinine Ratio: 20 (ref 9–23)
BUN: 17 mg/dL (ref 6–20)
Bilirubin Total: 0.3 mg/dL (ref 0.0–1.2)
CO2: 20 mmol/L (ref 20–29)
Calcium: 9.6 mg/dL (ref 8.7–10.2)
Chloride: 102 mmol/L (ref 96–106)
Creatinine, Ser: 0.87 mg/dL (ref 0.57–1.00)
Globulin, Total: 2.3 g/dL (ref 1.5–4.5)
Glucose: 77 mg/dL (ref 65–99)
Potassium: 4.1 mmol/L (ref 3.5–5.2)
Sodium: 139 mmol/L (ref 134–144)
Total Protein: 7 g/dL (ref 6.0–8.5)
eGFR: 87 mL/min/{1.73_m2} (ref >59)

## 2020-12-19 LAB — LIPID PANEL
Chol/HDL Ratio: 3.4 ratio (ref 0.0–4.4)
Cholesterol, Total: 192 mg/dL (ref 100–199)
HDL: 56 mg/dL (ref >39)
LDL Chol Calc (NIH): 124 mg/dL — ABNORMAL HIGH (ref 0–99)
Triglycerides: 63 mg/dL (ref 0–149)
VLDL Cholesterol Cal: 12 mg/dL (ref 5–40)

## 2020-12-19 LAB — CBC
Hematocrit: 40.5 % (ref 34.0–46.6)
Hemoglobin: 13.3 g/dL (ref 11.1–15.9)
MCH: 28.2 pg (ref 26.6–33.0)
MCHC: 32.8 g/dL (ref 31.5–35.7)
MCV: 86 fL (ref 79–97)
Platelets: 210 10*3/uL (ref 150–450)
RBC: 4.72 x10E6/uL (ref 3.77–5.28)
RDW: 12.2 % (ref 11.7–15.4)
WBC: 7.1 10*3/uL (ref 3.4–10.8)

## 2020-12-19 LAB — FOLLICLE STIMULATING HORMONE: FSH: 7.7 m[IU]/mL

## 2020-12-19 LAB — HEMOGLOBIN A1C
Est. average glucose Bld gHb Est-mCnc: 97 mg/dL
Hgb A1c MFr Bld: 5 % (ref 4.8–5.6)

## 2020-12-19 LAB — TSH: TSH: 1.7 u[IU]/mL (ref 0.450–4.500)

## 2021-12-01 ENCOUNTER — Other Ambulatory Visit: Payer: Self-pay | Admitting: Family Medicine

## 2021-12-01 DIAGNOSIS — Z1231 Encounter for screening mammogram for malignant neoplasm of breast: Secondary | ICD-10-CM

## 2022-02-17 ENCOUNTER — Ambulatory Visit
Admission: EM | Admit: 2022-02-17 | Discharge: 2022-02-17 | Disposition: A | Discharge status: Home / Self Care | Payer: 59

## 2022-02-17 DIAGNOSIS — S61316A Laceration without foreign body of right little finger with damage to nail, initial encounter: Secondary | ICD-10-CM | POA: Diagnosis not present

## 2022-02-17 NOTE — Discharge Instructions (Addendum)
Follow up here or with your primary care provider if your symptoms are worsening or not improving.     

## 2022-02-17 NOTE — ED Triage Notes (Signed)
Pt. Presents to UC with laceration to the right pinky finger after cleaning a metal frame today.

## 2022-02-17 NOTE — ED Provider Notes (Signed)
[facility]    CSN: [id number] Arrival date & time: 02/17/22  1204      History   Chief Complaint Chief Complaint  Patient presents with   Finger Injury    HPI Erin Stephenson is a 41 y.o. female.   HPI  Presents to UC with report of laceration to right pinky finger after cleaning a "metal frame" on a camper today.  She endorses recent tetanus vaccine within 5 years.  Past Medical History:  Diagnosis Date   Bell's palsy     Patient Active Problem List   Diagnosis Date Noted   Chronic right shoulder pain 09/30/2015   Left lateral knee pain 09/30/2015   H/O bariatric surgery 08/05/2015   Primary insomnia 09/21/2014   Chronic female pelvic pain 03/02/2014   LUQ PAIN 12/22/2006   ABDOMINAL PAIN, LEFT LOWER QUADRANT 12/22/2006    Past Surgical History:  Procedure Laterality Date   LAPAROSCOPIC VAGINAL HYSTERECTOMY     VERTICAL BANDED GASTROPLASTY      OB History     Gravida  1   Para  1   Term  1   Preterm      AB      Living  1      SAB      IAB      Ectopic      Multiple      Live Births  1            Home Medications    Prior to Admission medications   Medication Sig Start Date End Date Taking? Authorizing Provider  citalopram (CELEXA) 20 MG tablet Take 1 tablet by mouth every morning. 05/01/15  Yes [provider]  clonazePAM (KLONOPIN) 0.5 MG tablet Take by mouth. 05/01/15  Yes [provider]  cyanocobalamin (VITAMIN B12) 1000 MCG/ML injection INJECT 1ML IM MONTHLY 02/19/20  Yes [provider]  folic acid (FOLVITE) 1 MG tablet Take by mouth. 11/08/18  Yes [provider]  LORazepam (ATIVAN) 0.5 MG tablet TAKE ONE TABLET BY MOUTH EVERY DAY AT BEDTIME AS NEEDED FOR ANXIETY 02/18/21  Yes [provider]  tirzepatide Darcel Bayley) 5 MG/0.5ML Pen INJECT 0.5MLS BY MOUTH SUBCUTANEOUSLY EVERY 7 DAYS 12/30/21  Yes [provider]  WEGOVY 0.5 MG/0.5ML SOAJ SMARTSIG:0.5 Milliliter(s) SUB-Q  Once a Week 11/24/21  Yes [provider]  sertraline (ZOLOFT) 50 MG tablet Take 100 mg by mouth daily. 02/07/19   [provider]    Family History Family History  Problem Relation Age of Onset   Healthy Mother    Healthy Father     Social History Social History   Tobacco Use   Smoking status: Never   Smokeless tobacco: Never  Vaping Use   Vaping Use: Never used  Substance Use Topics   Alcohol use: Yes   Drug use: No     Allergies   Patient has no known allergies.   Review of Systems Review of Systems   Physical Exam Triage Vital Signs ED Triage Vitals [02/17/22 1219]  Enc Vitals Group     BP      Pulse Rate 80     Resp 18     Temp 98.1 F (36.7 C)     Temp src      SpO2 98 %     Weight      Height      Head Circumference      Peak Flow      Pain Score  0     Pain Loc      Pain Edu?      Excl. in GC?    No data found.  Updated Vital Signs Pulse 80   Temp 98.1 F (36.7 C)   Resp 18   SpO2 98%   Visual Acuity Right Eye Distance:   Left Eye Distance:   Bilateral Distance:    Right Eye Near:   Left Eye Near:    Bilateral Near:     Physical Exam Vitals reviewed.  Constitutional:      Appearance: Normal appearance.  Musculoskeletal:       Hands:  Skin:    General: Skin is warm and dry.     Findings: Laceration present.  Neurological:     General: No focal deficit present.     Mental Status: She is alert and oriented to person, place, and time.  Psychiatric:        Mood and Affect: Mood normal.        Behavior: Behavior normal.      UC Treatments / Results  Labs (all labs ordered are listed, but only abnormal results are displayed) Labs Reviewed - No data to display  EKG   Radiology No results found.  Procedures Procedures (including critical care time)  Medications Ordered in UC Medications - No data to display  Initial Impression / Assessment and Plan / UC Course  I have reviewed the triage vital  signs and the nursing notes.  Pertinent labs & imaging results that were available during my care of the patient were reviewed by me and considered in my medical decision making (see chart for details).   Cleaned with iodone, then Trimmed non- viable skin flap of the epidermal/dermal tissue ordered on the distal fifth right phalanx dorsal surface.  Applied pressure dressing using folded gauze, antibacterial ointment, Band-Aid to achieve hemostasis.   Final Clinical Impressions(s) / UC Diagnoses   Final diagnoses:  None   Discharge Instructions   None    ED Prescriptions   None    PDMP not reviewed this encounter.   Charma Igo, [place] 02/17/22 1246

## 2022-11-11 ENCOUNTER — Ambulatory Visit
Admission: EM | Admit: 2022-11-11 | Discharge: 2022-11-11 | Disposition: A | Discharge status: Home / Self Care | Payer: 59 | Attending: Family Medicine | Admitting: Family Medicine

## 2022-11-11 ENCOUNTER — Ambulatory Visit (INDEPENDENT_AMBULATORY_CARE_PROVIDER_SITE_OTHER): Payer: 59

## 2022-11-11 DIAGNOSIS — S99922A Unspecified injury of left foot, initial encounter: Secondary | ICD-10-CM

## 2022-11-11 MED ORDER — CYCLOBENZAPRINE HCL 10 MG PO TABS: 10.0000 mg | ORAL_TABLET | Freq: Every day | ORAL | 0 refills | Status: DC

## 2022-11-11 MED ORDER — PREDNISONE 20 MG PO TABS: 20.0000 mg | ORAL_TABLET | Freq: Every day | ORAL | 0 refills | Status: AC

## 2022-11-11 NOTE — ED Provider Notes (Signed)
Erin Stephenson    CSN: [id number] Arrival date & time: 11/11/22  1049      History   Chief Complaint Chief Complaint  Patient presents with   Foot Pain    HPI Erin Stephenson is a 42 y.o. female.   HPI Patient presents today for evaluation of 2 weeks of left lateral foot pain s/p an injury two weeks ago. She reports routinely walking barefooted and only recalls stepping incorrectly while walking on a wooden bridge and felt pain. She had swelling, bruising, which has improved, but the pain on the side of the foot has not resolved and worsens when she is at rest or when foot is exposed to cold temperatures. No prior injuries involving the left foot. She has taken Tylenol, naproxen and Ibuprofen without resolution of foot pain. Past Medical History:  Diagnosis Date   Bell's palsy     Patient Active Problem List   Diagnosis Date Noted   Chronic right shoulder pain 09/30/2015   Left lateral knee pain 09/30/2015   H/O bariatric surgery 08/05/2015   Primary insomnia 09/21/2014   Chronic female pelvic pain 03/02/2014   LUQ PAIN 12/22/2006   ABDOMINAL PAIN, LEFT LOWER QUADRANT 12/22/2006    Past Surgical History:  Procedure Laterality Date   LAPAROSCOPIC VAGINAL HYSTERECTOMY     VERTICAL BANDED GASTROPLASTY      OB History     Gravida  1   Para  1   Term  1   Preterm      AB      Living  1      SAB      IAB      Ectopic      Multiple      Live Births  1            Home Medications    Prior to Admission medications   Medication Sig Start Date End Date Taking? Authorizing Provider  cyclobenzaprine (FLEXERIL) 10 MG tablet Take 1 tablet (10 mg total) by mouth at bedtime. 11/11/22  Yes Bing Neighbors, NP  predniSONE (DELTASONE) 20 MG tablet Take 1 tablet (20 mg total) by mouth daily with breakfast for 5 days. 11/11/22 11/16/22 Yes Bing Neighbors, NP  citalopram (CELEXA) 20 MG tablet Take 1 tablet by mouth every morning. Patient not taking:  Reported on 11/11/2022 05/01/15   [provider]  clonazePAM (KLONOPIN) 0.5 MG tablet Take by mouth. Patient not taking: Reported on 11/11/2022 05/01/15   [provider]  cyanocobalamin (VITAMIN B12) 1000 MCG/ML injection INJECT IM MONTHLY Patient not taking: Reported on 11/11/2022 02/19/20   [provider]  folic acid (FOLVITE) 1 MG tablet Take by mouth. Patient not taking: Reported on 11/11/2022 11/08/18   [provider]  LORazepam (ATIVAN) 0.5 MG tablet TAKE ONE TABLET BY MOUTH EVERY DAY AT BEDTIME AS NEEDED FOR ANXIETY 02/18/21   [provider]  sertraline (ZOLOFT) 50 MG tablet Take 100 mg by mouth daily. 02/07/19   [provider]  tirzepatide Greggory Keen) 5 MG/0.5ML Pen INJECT 0.5MLS BY MOUTH SUBCUTANEOUSLY EVERY 7 DAYS 12/30/21   [provider]  WEGOVY 0.5 MG/0.5ML SOAJ SMARTSIG:0.5 Milliliter(s) SUB-Q Once a Week Patient not taking: Reported on 11/11/2022 11/24/21   [provider]    Family History Family History  Problem Relation Age of Onset   Healthy Mother    Healthy Father     Social History Social History   Tobacco Use   Smoking  status: Never   Smokeless tobacco: Never  Vaping Use   Vaping status: Never Used  Substance Use Topics   Alcohol use: Yes   Drug use: No     Allergies   Patient has no known allergies.   Review of Systems Review of Systems Pertinent negatives listed in HPI   Physical Exam Triage Vital Signs ED Triage Vitals  Encounter Vitals Group     BP 11/11/22 1105 103/68     Systolic BP Percentile --      Diastolic BP Percentile --      Pulse Rate 11/11/22 1105 86     Resp 11/11/22 1105 18     Temp 11/11/22 1105 97.7 F (36.5 C)     Temp src --      SpO2 11/11/22 1105 98 %     Weight --      Height --      Head Circumference --      Peak Flow --      Pain Score 11/11/22 1100 3     Pain Loc --      Pain Education --      Exclude from Growth Chart --    No data  found.  Updated Vital Signs BP 103/68   Pulse 86   Temp 97.7 F (36.5 C)   Resp 18   SpO2 98%   Visual Acuity Right Eye Distance:   Left Eye Distance:   Bilateral Distance:    Right Eye Near:   Left Eye Near:    Bilateral Near:     Physical Exam Vitals reviewed.  Constitutional:      Appearance: Normal appearance.  HENT:     Head: Normocephalic and atraumatic.  Eyes:     Extraocular Movements: Extraocular movements intact.     Pupils: Pupils are equal, round, and reactive to light.  Cardiovascular:     Rate and Rhythm: Normal rate and regular rhythm.  Pulmonary:     Effort: Pulmonary effort is normal.     Breath sounds: Normal breath sounds.  Musculoskeletal:     Cervical back: Normal range of motion and neck supple.     Left foot: Tenderness and bony tenderness present.       Legs:     Comments: Lateral aspect of the left foot.  Skin:    General: Skin is warm and dry.  Neurological:     General: No focal deficit present.     Mental Status: She is alert.    UC Treatments / Results  Labs (all labs ordered are listed, but only abnormal results are displayed) Labs Reviewed - No data to display  EKG   Radiology DG Foot Complete Left  Result Date: 11/11/2022 CLINICAL DATA:  Injury of left foot, initial encounter. Lateral left foot pain felt pain when stepping on bridge x 2 weeks ago initially brusied and swollen. now pain continues with weight bearing EXAM: LEFT FOOT - COMPLETE 3+ VIEW COMPARISON:  None Available. FINDINGS: There is no evidence of fracture or dislocation. Particularly, no fracture of the lateral foot. There is no evidence of arthropathy or other focal bone abnormality. Soft tissues are unremarkable. IMPRESSION: No fracture or dislocation of the foot. Electronically Signed   By: Narda Rutherford M.D.   On: 11/11/2022 11:47    Procedures Procedures (including critical care time)  Medications Ordered in UC Medications - No data to  display  Initial Impression / Assessment and Plan / UC Course  I have  reviewed the triage vital signs and the nursing notes.  Pertinent labs & imaging results that were available during my care of the patient were reviewed by me and considered in my medical decision making (see chart for details).    Injury involving he left foot, imaging unremarkable, no obvious deformity or visible injury, although mild bruising visible distal 5th metatarsal-likely a pressure strain injury. Trial Prednisone 20 mg daily x 5 days to reduce inflammation in foot. For acute foot pain, prescribed cyclobenzaprine 10 mg bedtime as needed for pain. If no improvement in symptoms, follow-up with [facility]. Final Clinical Impressions(s) / UC Diagnoses   Final diagnoses:  Injury of left foot, initial encounter     Discharge Instructions      Start Prednisone 20 mg x 5 days. Cyclobenzaprine 10 mg at bed time for pain.. I have reviewed the images of your left foot and do not see any obvious fracture or dislocation. The radiologist will review the x-ray and provide a final read. Our office will only call if the radiologist read indicates a fracture. All results will be sent through MyChart.  As discussed if pain does not improve with prednisone I would like for you to follow-up with [facility].     ED Prescriptions     Medication Sig Dispense Auth. Provider   predniSONE (DELTASONE) 20 MG tablet Take 1 tablet (20 mg total) by mouth daily with breakfast for 5 days. 5 tablet Bing Neighbors, NP   cyclobenzaprine (FLEXERIL) 10 MG tablet Take 1 tablet (10 mg total) by mouth at bedtime. 20 tablet Bing Neighbors, NP      PDMP not reviewed this encounter.   Bing Neighbors, NP 11/13/22 [id number]

## 2022-11-11 NOTE — ED Triage Notes (Addendum)
Patient to Urgent Care with complaints of left sided foot pain following an injury two weeks ago. States she walks outside barefoot often/ could have possibly stepped incorrectly onto a wooden bridge and strained her foot.  Reports bruising and swelling has improved but still having a dull and aching pain. Taking ibuprofen/ tylenol/ icing.

## 2022-11-11 NOTE — Discharge Instructions (Addendum)
Start Prednisone 20 mg x 5 days. Cyclobenzaprine 10 mg at bed time for pain.. I have reviewed the images of your left foot and do not see any obvious fracture or dislocation. The radiologist will review the x-ray and provide a final read. Our office will only call if the radiologist read indicates a fracture. All results will be sent through MyChart.  As discussed if pain does not improve with prednisone I would like for you to follow-up with [facility].

## 2022-11-16 ENCOUNTER — Encounter: Payer: Self-pay | Admitting: Podiatry

## 2022-11-16 ENCOUNTER — Ambulatory Visit (INDEPENDENT_AMBULATORY_CARE_PROVIDER_SITE_OTHER): Payer: 59 | Admitting: Podiatry

## 2022-11-16 VITALS — BP 112/68 | HR 80 | Ht 60.0 in | Wt 114.0 lb

## 2022-11-16 DIAGNOSIS — M7672 Peroneal tendinitis, left leg: Secondary | ICD-10-CM | POA: Diagnosis not present

## 2022-11-16 NOTE — Patient Instructions (Signed)
Peroneal Tendinopathy Rehab Ask your health care provider which exercises are safe for you. Do exercises exactly as told by your health care provider and adjust them as directed. It is normal to feel mild stretching, pulling, tightness, or discomfort as you do these exercises. Stop right away if you feel sudden pain or your pain gets worse. Do not begin these exercises until told by your health care provider. Stretching and range-of-motion exercises These exercises warm up your muscles and joints. They can help improve the movement and flexibility of your ankle. They may also help to relieve pain and stiffness. Gastrocnemius and soleus stretch, standing This is an exercise in which you stand on a step and use your body weight to stretch your calf muscles. To do this exercise: Stand on the edge of a step on the ball of your left / right foot. The ball of your foot is on the walking surface, right under your toes. Keep your other foot firmly on the same step. Hold on to the wall, a railing, or a chair for balance. Slowly lift your other foot, allowing your body weight to press your left / right heel down over the edge of the step. You should feel a stretch in your left / right calf (gastrocnemius and soleus). Hold this position for __________ seconds. Return both feet to the step. Repeat this exercise with a slight bend in your left / right knee. Repeat __________ times with your left / right knee straight and __________ times with your left / right knee bent. Complete this exercise __________ times a day. Strengthening exercises These exercises build strength and endurance in your foot and ankle. Endurance is the ability to use your muscles for a long time, even after they get tired. Ankle dorsiflexion with band  Secure a rubber exercise band or tube to an object, such as a table leg, that will not move when the band is pulled. Secure the other end of the band around your left / right foot. Sit on  the floor. Face the object with your left / right leg extended. The band or tube should be slightly tense when your foot is relaxed. Slowly flex your left / right ankle and toes to bring your foot toward you (dorsiflexion). Hold this position for __________ seconds. Let the band or tube slowly pull your foot back to the starting position. Repeat __________ times. Complete this exercise __________ times a day. Ankle eversion  Sit on the floor with your legs straight out in front of you. Loop a rubber exercise band or tube around the ball of your left / right foot. The ball of your foot is on the walking surface, right under your toes. Hold the ends of the band in your hands. You can also secure the band to a stable object. The band or tube should be slightly tense when your foot is relaxed. Slowly push your foot outward, away from your other leg (eversion). Hold this position for __________ seconds. Slowly return your foot to the starting position. Repeat __________ times. Complete this exercise __________ times a day. Plantar flexion, standing This exercise is sometimes called a standing heel raise. Stand with your feet shoulder-width apart. Place your hands on a wall or table to steady yourself as needed. Try not to use it for support. Keep your weight spread evenly over the width of your feet while you slowly rise up on your toes (plantar flexion). If told by your health care provider: Shift your weight   toward your left / right leg until you feel challenged. Stand on your left / right leg only. Hold this position for __________ seconds. Repeat __________ times. Complete this exercise __________ times a day. Single leg stand  Without shoes, stand near a railing or in a doorway. You may hold on to the railing or doorframe as needed. Stand on your left / right foot. Keep your big toe down on the floor and try to keep your arch lifted. Do not roll to the outside of your foot. If this  exercise is too easy, you can try it with your eyes closed or while standing on a pillow. Hold this position for __________ seconds. Repeat __________ times. Complete this exercise __________ times a day. This information is not intended to replace advice given to you by your health care provider. Make sure you discuss any questions you have with your health care provider. Document Revised: 07/24/2021 Document Reviewed: 07/24/2021 Elsevier Patient Education  2024 [employer]  

## 2022-11-16 NOTE — Progress Notes (Signed)
  Subjective:  Patient ID: Erin Stephenson, female    DOB: July 18, 1980,   MRN: [id number]  Chief Complaint  Patient presents with   Foot Pain    Left foot has been painful 5th MT area has pain at night feels like can't get in a good position no known injury had xrays at Urgent Care     42 y.o. female presents for follow-up from urgent care. Relates about three weeks ago she was walking on a bridge and stepped funny and relates pain on the outside of her foot since then. Relates bruising and swelling but has improved. Relates continued pain on the side of her left foot. She has been taking anti-inflammatories. Was given steroid for pain and muscle relaxer.    . Denies any other pedal complaints. Denies n/v/f/c.   Past Medical History:  Diagnosis Date   Bell's palsy     Objective:  Physical Exam: Vascular: DP/PT pulses 2/4 bilateral. CFT <3 seconds. Normal hair growth on digits. No edema.  Skin. No lacerations or abrasions bilateral feet.  Musculoskeletal: MMT 5/5 bilateral lower extremities in DF, PF, Inversion and Eversion. Deceased ROM in DF of ankle joint. Tender around insertion of peroneal tendon mostly. No pain proximally along tendon or distally along fifth metatarsal. No pain with DF and PF. Minimal to no pain with eversion and inversion.  Neurological: Sensation intact to light touch.   Assessment:   1. Peroneal tendinitis of left lower extremity      Plan:  Patient was evaluated and treated and all questions answered. X-rays reviewed and discussed with patient. Discussed peroneal tendinitis and treatment options at length with patient Discussed stretching exercises and provided handout. Continue tylenol Dispensed Tri-Lock ankle brace. Discussed that if the symptoms do not improve can consider PT/MRI. Patient to return in 6 to 8 weeks or sooner if symptoms fail to improve or worsen.   Louann Sjogren, DPM

## 2023-03-13 ENCOUNTER — Ambulatory Visit
Admission: EM | Admit: 2023-03-13 | Discharge: 2023-03-13 | Disposition: A | Discharge status: Home / Self Care | Payer: 59 | Attending: Physician Assistant | Admitting: Physician Assistant

## 2023-03-13 DIAGNOSIS — J029 Acute pharyngitis, unspecified: Secondary | ICD-10-CM | POA: Insufficient documentation

## 2023-03-13 DIAGNOSIS — H9202 Otalgia, left ear: Secondary | ICD-10-CM | POA: Insufficient documentation

## 2023-03-13 LAB — POCT RAPID STREP A (OFFICE): Rapid Strep A Screen: NEGATIVE

## 2023-03-13 MED ORDER — PREDNISONE 20 MG PO TABS: 40.0000 mg | ORAL_TABLET | Freq: Every day | ORAL | 0 refills | Status: AC

## 2023-03-13 MED ORDER — LIDOCAINE VISCOUS HCL 2 % MT SOLN: 15.0000 mL | Freq: Four times a day (QID) | OROMUCOSAL | 0 refills | Status: AC | PRN

## 2023-03-13 NOTE — Discharge Instructions (Signed)
Your strep test was negative.  We will send this for culture and contact you if it is positive.  Gargle with warm salt water for symptom relief.  You can also use viscous lidocaine to help with the sore throat pain.  Do not eat or drink immediately after using this medication as it increases the risk of choking.  Start prednisone 40 milligrams daily for 5 days.  Do not take NSAIDs with this medication including aspirin, ibuprofen/Advil, naproxen/Aleve.  You can use Tylenol/acetaminophen as needed.  If your symptoms or not improving quickly please follow-up with ENT; call to schedule an appointment.  If anything worsens and you have drainage from the ear, high fever, worsening pain, difficulty swallowing, swelling of your throat, shortness of breath, change in your voice you should be seen emergently.

## 2023-03-13 NOTE — ED Triage Notes (Signed)
"  I am having left ear pain that started a few days ago as a whooshing sound now pain". "Also having some sore throat that started around the same time".

## 2023-03-13 NOTE — ED Provider Notes (Signed)
Renaldo Fiddler    CSN: [id number] Arrival date & time: 03/13/23  2841      History   Chief Complaint Chief Complaint  Patient presents with   Otalgia   Sore Throat    HPI Erin Stephenson is a 42 y.o. female.   Patient presents today with a several day history of left otalgia and sore throat.  She reports that for a few days she has had some tinnitus in both ears that she describes as a whooshing sound.  Over the past few days she developed severe pain in her left ear that is currently rated 8 on a 0-10 pain scale, described as sharp, radiating into her throat, no alleviating factors identified.  She reports intense pain with swallowing but is able to manage her secretions and has been subsisting on liquids and soft foods.  She does report multiple family members have been sick with pneumonia/bronchitis but she is not experiencing significant congestion symptoms.  She has been alternating Tylenol and ibuprofen as well as gargling with warm salt water without improvement of symptoms.  She denies any recent antibiotics or steroids.  She has been unable to sleep as result of the symptoms.    Past Medical History:  Diagnosis Date   Bell's palsy     Patient Active Problem List   Diagnosis Date Noted   Chronic right shoulder pain 09/30/2015   Left lateral knee pain 09/30/2015   H/O bariatric surgery 08/05/2015   Primary insomnia 09/21/2014   Chronic female pelvic pain 03/02/2014   LUQ PAIN 12/22/2006   ABDOMINAL PAIN, LEFT LOWER QUADRANT 12/22/2006    Past Surgical History:  Procedure Laterality Date   LAPAROSCOPIC VAGINAL HYSTERECTOMY     VERTICAL BANDED GASTROPLASTY      OB History     Gravida  1   Para  1   Term  1   Preterm      AB      Living  1      SAB      IAB      Ectopic      Multiple      Live Births  1            Home Medications    Prior to Admission medications   Medication Sig Start Date End Date Taking? Authorizing Provider   lidocaine (XYLOCAINE) 2 % solution Use as directed 15 mLs in the mouth or throat every 6 (six) hours as needed for mouth pain. 03/13/23  Yes Greene Diodato K, PA-C  metroNIDAZOLE (METROGEL) 1 % gel Apply 1 Application topically daily. 01/18/23 01/18/24 Yes [provider]  traZODone (DESYREL) 50 MG tablet Take 1 tablet by mouth at bedtime. 01/18/23 01/18/24 Yes [provider]  predniSONE (DELTASONE) 20 MG tablet Take 2 tablets (40 mg total) by mouth daily for 5 days. 03/13/23 03/18/23  Kiyah Demartini, Noberto Retort, PA-C  sertraline (ZOLOFT) 50 MG tablet Take 100 mg by mouth daily. 02/07/19   [provider]  tirzepatide Greggory Keen) 5 MG/0.5ML Pen INJECT 0.5MLS BY MOUTH SUBCUTANEOUSLY EVERY 7 DAYS 12/30/21   [provider]    Family History Family History  Problem Relation Age of Onset   Healthy Mother    Healthy Father     Social History Social History   Tobacco Use   Smoking status: Never   Smokeless tobacco: Never  Vaping Use   Vaping status: Never Used  Substance Use Topics   Alcohol use: Yes  Comment: Occassionally   Drug use: No     Allergies   Patient has no known allergies.   Review of Systems Review of Systems  Constitutional:  Positive for activity change. Negative for appetite change, fatigue and fever.  HENT:  Positive for congestion, ear pain, sore throat and trouble swallowing (pain). Negative for sinus pressure, sneezing and voice change.   Respiratory:  Negative for cough and shortness of breath.   Cardiovascular:  Negative for chest pain.  Gastrointestinal:  Negative for abdominal pain, diarrhea, nausea and vomiting.     Physical Exam Triage Vital Signs ED Triage Vitals  Encounter Vitals Group     BP 03/13/23 0841 94/60     Systolic BP Percentile --      Diastolic BP Percentile --      Pulse Rate 03/13/23 0841 73     Resp 03/13/23 0841 16     Temp 03/13/23 0841 99 F (37.2 C)     Temp Source 03/13/23 0841 Oral     SpO2  03/13/23 0841 100 %     Weight 03/13/23 0840 120 lb (54.4 kg)     Height 03/13/23 0840 4\' 11"  (1.499 m)     Head Circumference --      Peak Flow --      Pain Score 03/13/23 0835 8     Pain Loc --      Pain Education --      Exclude from Growth Chart --    No data found.  Updated Vital Signs BP 94/60 (BP Location: Left Arm)   Pulse 73   Temp 99 F (37.2 C) (Oral)   Resp 16   Ht 4\' 11"  (1.499 m)   Wt 120 lb (54.4 kg)   SpO2 100%   BMI 24.24 kg/m   Visual Acuity Right Eye Distance:   Left Eye Distance:   Bilateral Distance:    Right Eye Near:   Left Eye Near:    Bilateral Near:     Physical Exam Vitals reviewed.  Constitutional:      General: She is awake. She is not in acute distress.    Appearance: Normal appearance. She is well-developed. She is not ill-appearing.     Comments: Very pleasant female appears stated age in no acute distress sitting comfortably in exam room  HENT:     Head: Normocephalic and atraumatic.     Right Ear: Tympanic membrane, ear canal and external ear normal. Tympanic membrane is not erythematous or bulging.     Left Ear: Ear canal and external ear normal. Tympanic membrane is retracted.     Nose:     Right Sinus: No maxillary sinus tenderness or frontal sinus tenderness.     Left Sinus: No maxillary sinus tenderness or frontal sinus tenderness.     Mouth/Throat:     Pharynx: Uvula midline. Posterior oropharyngeal erythema present. No oropharyngeal exudate.  Cardiovascular:     Rate and Rhythm: Normal rate and regular rhythm.     Heart sounds: Normal heart sounds, S1 normal and S2 normal. No murmur heard. Pulmonary:     Effort: Pulmonary effort is normal.     Breath sounds: Normal breath sounds. No wheezing, rhonchi or rales.     Comments: Clear to auscultation bilaterally Lymphadenopathy:     Head:     Right side of head: No submental, submandibular or tonsillar adenopathy.     Left side of head: No submental, submandibular or  tonsillar adenopathy.  Cervical: No cervical adenopathy.  Psychiatric:        Behavior: Behavior is cooperative.      UC Treatments / Results  Labs (all labs ordered are listed, but only abnormal results are displayed) Labs Reviewed  CULTURE, GROUP A STREP [facility])  POCT RAPID STREP A (OFFICE)    EKG   Radiology No results found.  Procedures Procedures (including critical care time)  Medications Ordered in UC Medications - No data to display  Initial Impression / Assessment and Plan / UC Course  I have reviewed the triage vital signs and the nursing notes.  Pertinent labs & imaging results that were available during my care of the patient were reviewed by me and considered in my medical decision making (see chart for details).     Patient is well-appearing, afebrile, nontoxic, nontachycardic.  She did have retraction of her left TM and we discussed that this could be contributing to her discomfort/pain.  She was encouraged to continue over-the-counter medications including antihistamines and fluticasone.  Will also start prednisone burst of 40 mg for 5 days to help manage symptoms.  We discussed that she is not to take NSAIDs with this medication.  She can use viscous lidocaine for sore throat symptoms but she is not to eat or drink immediately after using this medication as it increases the risk for choking.  Strep testing was obtained and was negative.  Will send for culture but defer antibiotics until culture results are available.  She was encouraged to drink plenty of fluid.  We discussed that if her symptoms are not improving quickly she should follow-up with ENT and was given contact information for local provider with instruction to call to schedule an appointment.  Discussed that if her symptoms are not improving or if anything worsens she needs to be seen in the emergency room including increasing pain, difficulty swallowing, swelling of her throat, shortness of breath,  muffled voice, fever.  Strict return precautions given.  Work excuse note provided.  Final Clinical Impressions(s) / UC Diagnoses   Final diagnoses:  Sore throat  Acute otalgia, left     Discharge Instructions      Your strep test was negative.  We will send this for culture and contact you if it is positive.  Gargle with warm salt water for symptom relief.  You can also use viscous lidocaine to help with the sore throat pain.  Do not eat or drink immediately after using this medication as it increases the risk of choking.  Start prednisone 40 milligrams daily for 5 days.  Do not take NSAIDs with this medication including aspirin, ibuprofen/Advil, naproxen/Aleve.  You can use Tylenol/acetaminophen as needed.  If your symptoms or not improving quickly please follow-up with ENT; call to schedule an appointment.  If anything worsens and you have drainage from the ear, high fever, worsening pain, difficulty swallowing, swelling of your throat, shortness of breath, change in your voice you should be seen emergently.     ED Prescriptions     Medication Sig Dispense Auth. Provider   predniSONE (DELTASONE) 20 MG tablet Take 2 tablets (40 mg total) by mouth daily for 5 days. 10 tablet Garrie Woodin K, PA-C   lidocaine (XYLOCAINE) 2 % solution Use as directed 15 mLs in the mouth or throat every 6 (six) hours as needed for mouth pain. 100 mL Daymion Nazaire K, PA-C      PDMP not reviewed this encounter.   Erin Hawking, PA-C 03/13/23  0924  

## 2023-03-16 LAB — CULTURE, GROUP A STREP (THRC)
